# Patient Record
Sex: Male | Born: 1980 | Race: White | Hispanic: No | Marital: Single | Smoking: Current every day smoker
Health system: Southern US, Community
[De-identification: ages and names within clinical notes are randomized; demographics above are authoritative.]

---

## 2018-10-02 ENCOUNTER — Emergency Department (INDEPENDENT_AMBULATORY_CARE_PROVIDER_SITE_OTHER)
Admission: EM | Admit: 2018-10-02 | Discharge: 2018-10-02 | Disposition: A | Discharge status: Home / Self Care | Payer: BLUE CROSS/BLUE SHIELD | Source: Home / Self Care | Attending: Family Medicine | Admitting: Family Medicine

## 2018-10-02 ENCOUNTER — Other Ambulatory Visit: Payer: Self-pay

## 2018-10-02 DIAGNOSIS — F17210 Nicotine dependence, cigarettes, uncomplicated: Secondary | ICD-10-CM | POA: Diagnosis not present

## 2018-10-02 DIAGNOSIS — Z20828 Contact with and (suspected) exposure to other viral communicable diseases: Secondary | ICD-10-CM | POA: Diagnosis not present

## 2018-10-02 DIAGNOSIS — Z711 Person with feared health complaint in whom no diagnosis is made: Secondary | ICD-10-CM

## 2018-10-02 NOTE — ED Triage Notes (Signed)
Pt went through the [employer] drive through last week.  Louann Sjogren is concerned about employees, and would like patient evaluated.  Pt is symptom free.

## 2018-10-02 NOTE — Discharge Instructions (Addendum)
Recommend getting a flu shot.

## 2018-10-02 NOTE — ED Provider Notes (Signed)
[facility]    CSN: [id number] Arrival date & time: 10/02/18  1542     History   Chief Complaint Chief Complaint  Patient presents with  . Needs MD evaluation    HPI Jacob Weeks is a 38 y.o. male.   Patient reports that he went through the drive-through at [employer] one week ago.  Apparently there was a worker either in the facility or who had been in the facility, with a positive COVID-19 test.  Patient also reports that he has had no close contact with any person with symptoms suggestive of COVID-19 infection. Patient's employer is requesting that patient be evaluated. The patient is completely assymptomatic.  The history is provided by the patient.    History reviewed. No pertinent past medical history.  There are no active problems to display for this patient.   History reviewed. No pertinent surgical history.     Home Medications    Prior to Admission medications   Not on File    Family History History reviewed. No pertinent family history.  Social History Social History   Tobacco Use  . Smoking status: Current Every Day Smoker    Packs/day: 1.00  . Smokeless tobacco: Never Used  Substance Use Topics  . Alcohol use: Yes  . Drug use: Never     Allergies   Sulfa antibiotics   Review of Systems Review of Systems No sore throat No cough No pleuritic pain No wheezing No nasal congestion No post-nasal drainage No sinus pain/pressure No itchy/red eyes No earache No hemoptysis No SOB No feverchi/lls No nausea No vomiting No abdominal pain No diarrhea No urinary symptoms No skin rash No fatigue No myalgias No headache Used OTC meds without relief   Physical Exam Triage Vital Signs ED Triage Vitals  Enc Vitals Group     BP 10/02/18 1649 130/80     Pulse Rate 10/02/18 1649 71     Resp 10/02/18 1649 20     Temp 10/02/18 1649 98.2 F (36.8 C)     Temp Source 10/02/18 1649 Oral     SpO2 10/02/18 1649 97 %   Weight 10/02/18 1650 205 lb (93 kg)     Height 10/02/18 1650 5\' 6"  (1.676 m)     Head Circumference --      Peak Flow --      Pain Score 10/02/18 1650 0     Pain Loc --      Pain Edu? --      Excl. in GC? --    No data found.  Updated Vital Signs BP 130/80 (BP Location: Right Arm)   Pulse 71   Temp 98.2 F (36.8 C) (Oral)   Resp 20   Ht 5\' 6"  (1.676 m)   Wt 93 kg   SpO2 97%   BMI 33.09 kg/m   Visual Acuity Right Eye Distance:   Left Eye Distance:   Bilateral Distance:    Right Eye Near:   Left Eye Near:    Bilateral Near:     Physical Exam Nursing notes and Vital Signs reviewed. Appearance:  Patient appears stated age, and in no acute distress Eyes:  Pupils are equal, round, and reactive to light and accomodation.  Extraocular movement is intact.  Conjunctivae are not inflamed  Ears:  Canals normal.  Tympanic membranes normal.  Nose:  Normal turbinates.  No sinus tenderness.  Pharynx:  Normal Neck:  Supple.  No adenopathy.  Lungs:  Clear to auscultation.  Breath  sounds are equal.  Moving air well. Heart:  Regular rate and rhythm without murmurs, rubs, or gallops.  Abdomen:  Nontender without masses or hepatosplenomegaly.  Bowel sounds are present.  No CVA or flank tenderness.  Extremities:  No edema.  Skin:  No rash present.    UC Treatments / Results  Labs (all labs ordered are listed, but only abnormal results are displayed) Labs Reviewed - No data to display  EKG None  Radiology No results found.  Procedures Procedures (including critical care time)  Medications Ordered in UC Medications - No data to display  Initial Impression / Assessment and Plan / UC Course  I have reviewed the triage vital signs and the nursing notes.  Pertinent labs & imaging results that were available during my care of the patient were reviewed by me and considered in my medical decision making (see chart for details).    Patient at low risk for corona virus infection:   No known exposure to COVID-19 infected individual.  He has no symptoms and feels well.  He may return to work.   Final Clinical Impressions(s) / UC Diagnoses   Final diagnoses:  Feared condition not demonstrated     Discharge Instructions     Recommend getting a flu shot.    ED Prescriptions    None        Lattie Haw, MD 10/03/18 1501

## 2018-11-06 ENCOUNTER — Ambulatory Visit (INDEPENDENT_AMBULATORY_CARE_PROVIDER_SITE_OTHER): Payer: Self-pay

## 2018-11-06 ENCOUNTER — Other Ambulatory Visit: Payer: Self-pay

## 2018-11-06 ENCOUNTER — Other Ambulatory Visit: Payer: Self-pay | Admitting: Gerontology

## 2018-11-06 DIAGNOSIS — T1490XA Injury, unspecified, initial encounter: Secondary | ICD-10-CM

## 2020-03-18 IMAGING — DX RIGHT FOOT COMPLETE - 3+ VIEW
3 series · 3 of 3 positions shown · non-contrast
Comparison: None

CLINICAL DATA: Dropped a piece of equipment on foot today at work,
pain at top of foot down into big toe, initial encounter

EXAM:
RIGHT FOOT COMPLETE - 3+ VIEW

[foot ap]
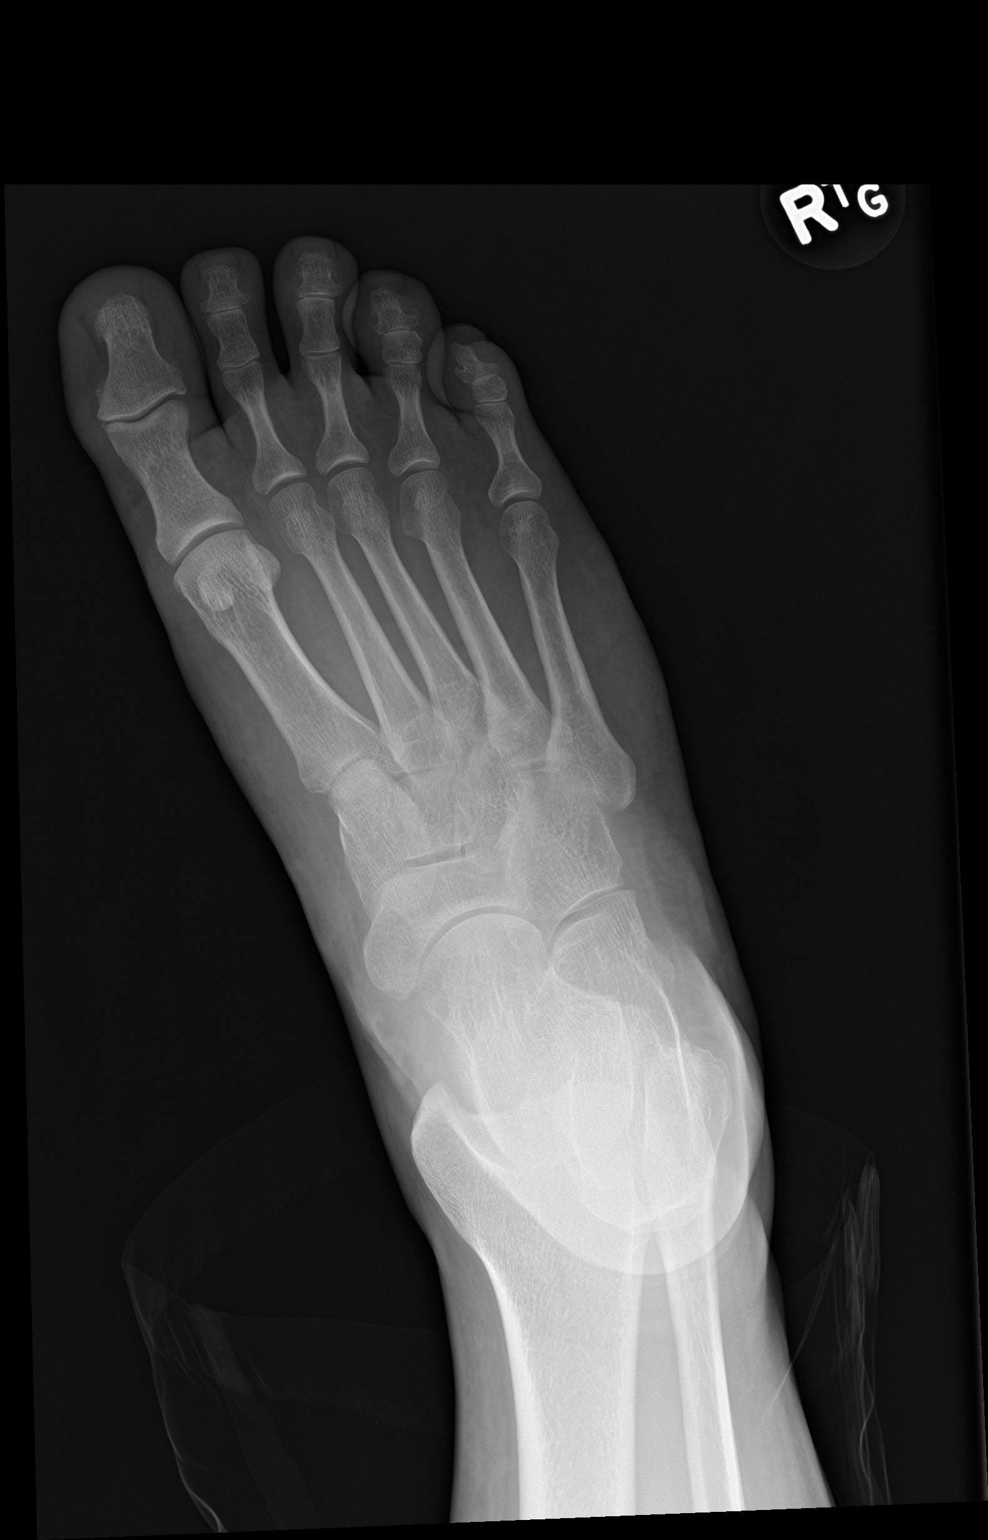

[foot obl]
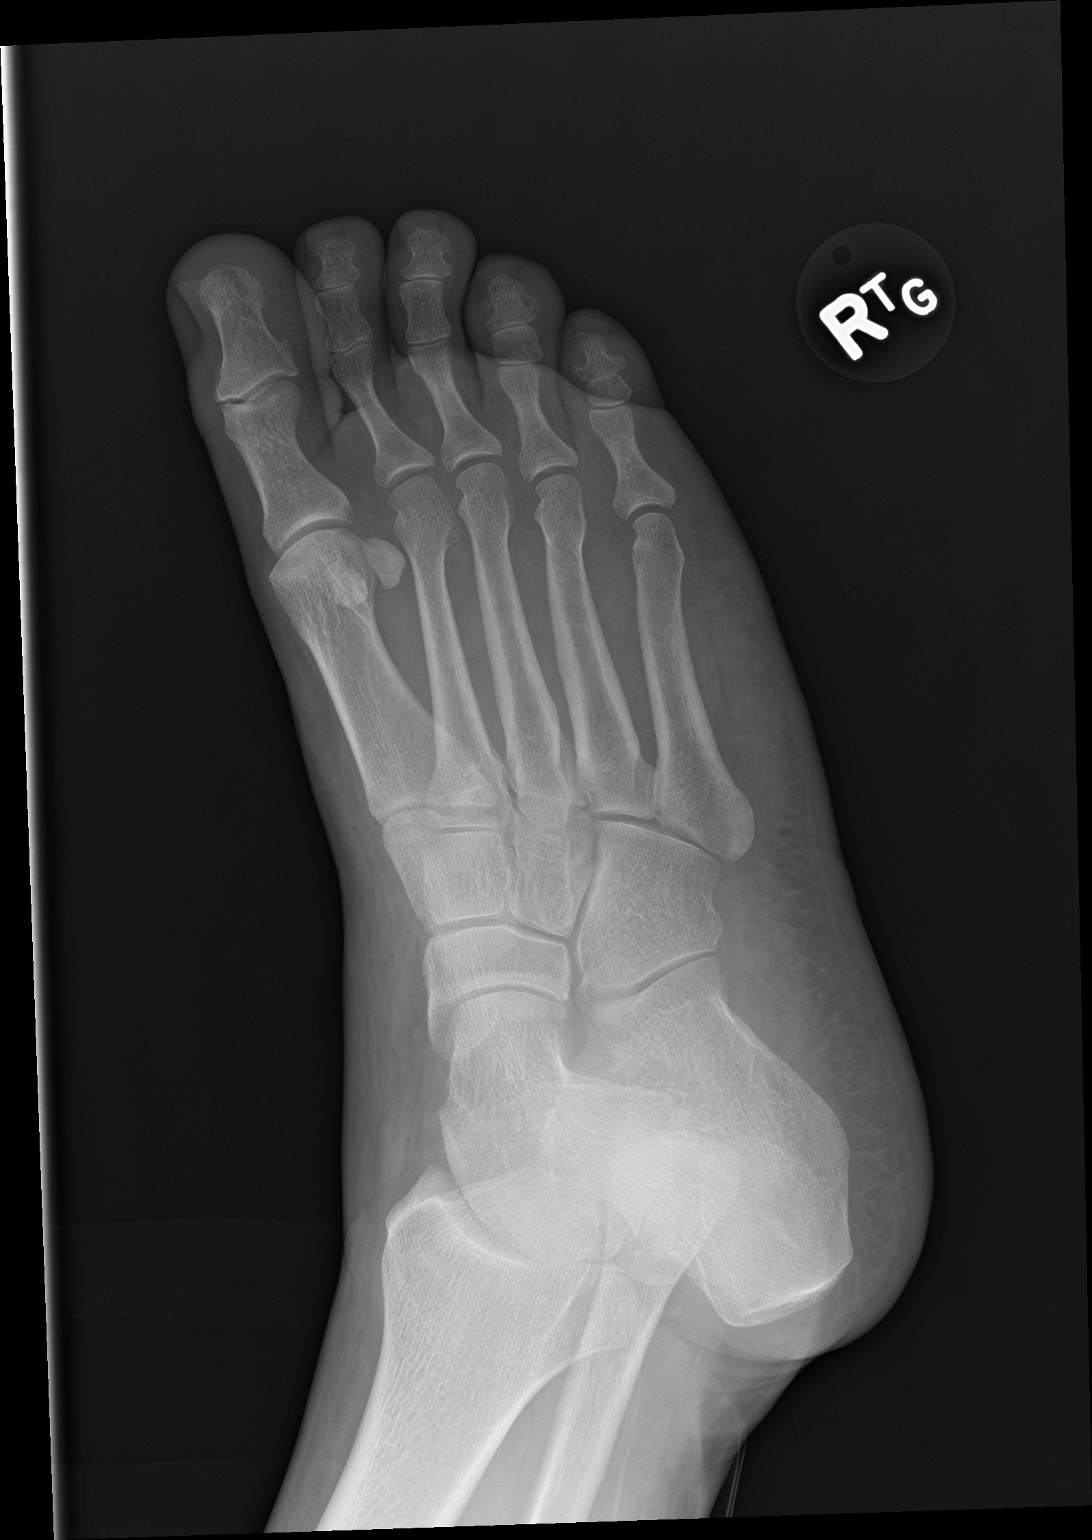

[foot lat]
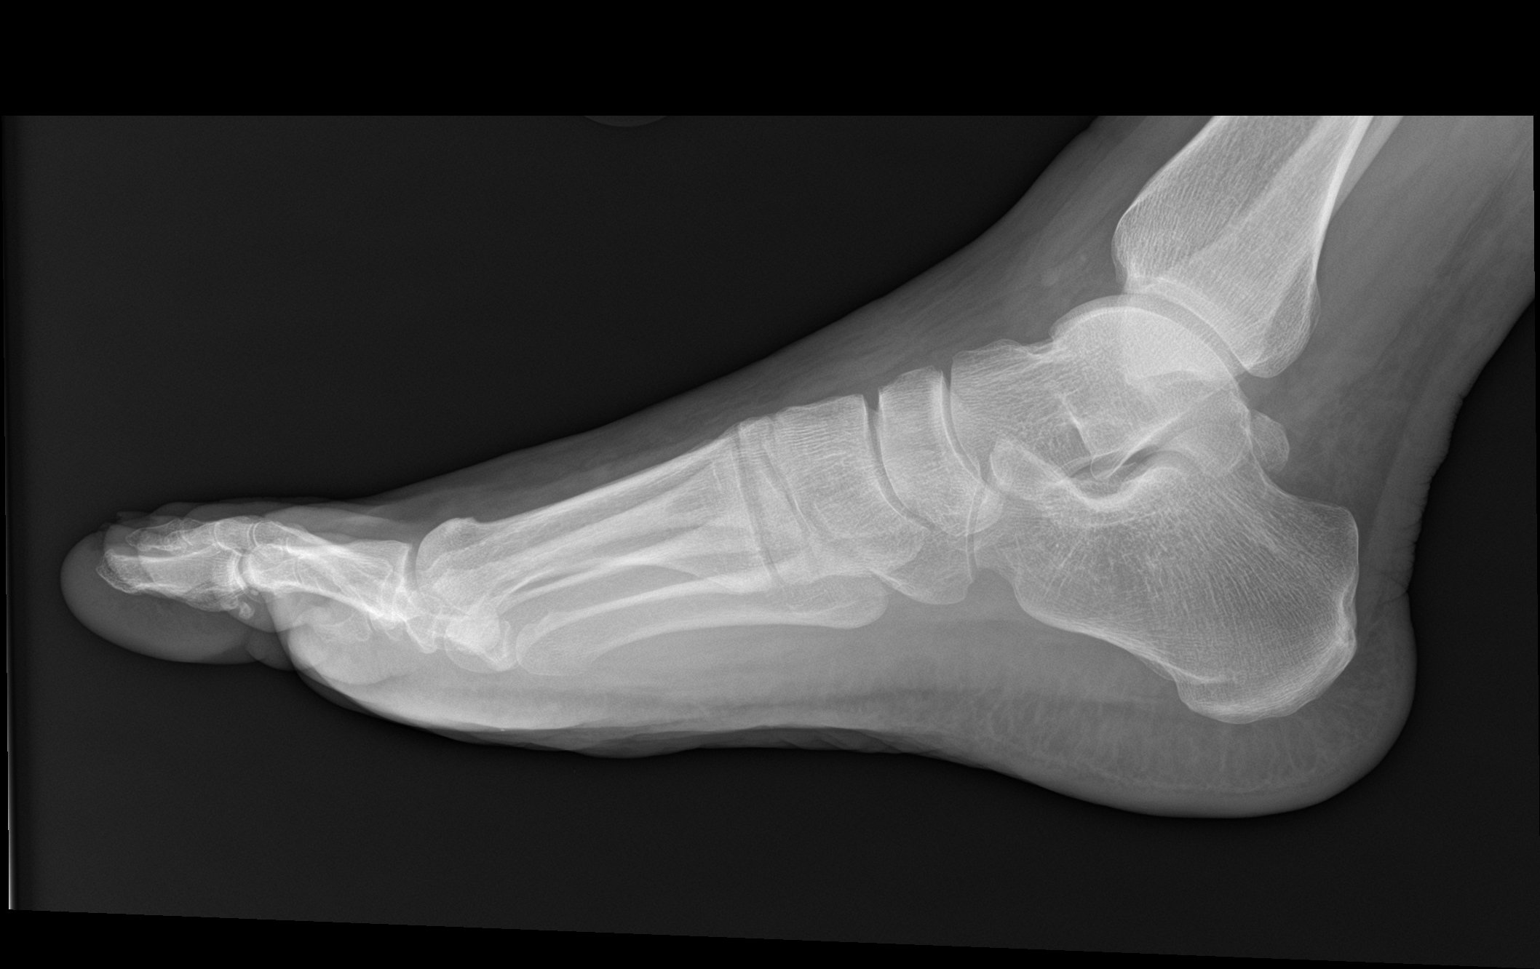

[3 of 3 positions shown; findings below may reference images not displayed]

FINDINGS: Osseous mineralization normal for technique.

Joint spaces preserved.

No acute fracture, dislocation, or bone destruction.
IMPRESSION: Normal exam.

## 2020-05-05 ENCOUNTER — Emergency Department
Admission: EM | Admit: 2020-05-05 | Discharge: 2020-05-05 | Disposition: A | Discharge status: Home / Self Care | Payer: BLUE CROSS/BLUE SHIELD | Source: Home / Self Care | Attending: Family Medicine | Admitting: Family Medicine

## 2020-05-05 ENCOUNTER — Other Ambulatory Visit: Payer: Self-pay

## 2020-05-05 DIAGNOSIS — J029 Acute pharyngitis, unspecified: Secondary | ICD-10-CM

## 2020-05-05 DIAGNOSIS — J069 Acute upper respiratory infection, unspecified: Secondary | ICD-10-CM

## 2020-05-05 LAB — POCT RAPID STREP A (OFFICE): Rapid Strep A Screen: NEGATIVE

## 2020-05-05 MED ORDER — PREDNISONE 20 MG PO TABS: ORAL_TABLET | ORAL | 0 refills | Status: DC

## 2020-05-05 MED ORDER — AZITHROMYCIN 250 MG PO TABS: ORAL_TABLET | ORAL | 0 refills | Status: DC

## 2020-05-05 NOTE — ED Triage Notes (Signed)
Pt c/o sore throat and neck tenderness x 4 days. Intermittent headache and diarrhea x 3 days. No known covid exposure. Has not had covid vaccinations.

## 2020-05-05 NOTE — Discharge Instructions (Addendum)
Take plain guaifenesin (1200mg  extended release tabs such as Mucinex) twice daily, with plenty of water, for cough and congestion.  May add Pseudoephedrine (30mg , one or two every 4 to 6 hours) for sinus congestion.  Get adequate rest.   May take Delsym Cough Suppressant ("12 Hour Cough Relief") at bedtime for nighttime cough.  Try warm salt water gargles for sore throat.  Stop all antihistamines for now, and other non-prescription cough/cold preparations. May take Tylenol for fever, headache, etc.   Isolate yourself until COVID-19 test result is available.   If your COVID19 test is positive, then you are infected with the novel coronavirus and could give the virus to others.  Please continue isolation at home for at least 10 days since the start of your symptoms. Once you complete your 10 day quarantine, you may return to normal activities as long as you've not had a fever for over 24 hours (without taking fever reducing medicine) and your symptoms are improving. Please continue good preventive care measures, including:  frequent hand-washing, avoid touching your face, cover coughs/sneezes, stay out of crowds and keep a 6 foot distance from others.  Go to the nearest hospital emergency room if fever/cough/breathlessness are severe or illness seems like a threat to life.

## 2020-05-05 NOTE — ED Provider Notes (Signed)
[facility]    CSN: [id number] Arrival date & time: 05/05/20  1123      History   Chief Complaint Chief Complaint  Patient presents with  . Sore Throat    HPI Jacob Weeks is a 39 y.o. male.    Patient developed increased cough about 8 days ago.  During the past four days he developed sore throat and sore neck, fatigue, headache, and increased cough.  He had intermittent diarrhea for about two days.  His cough is productive and he has had mild shortness of breath with activity.   He denies chest tightness and changes in taste/smell.  The history is provided by the patient.    History reviewed. No pertinent past medical history.  There are no problems to display for this patient.   History reviewed. No pertinent surgical history.     Home Medications    Prior to Admission medications   Medication Sig Start Date End Date Taking? Authorizing Provider  azithromycin (ZITHROMAX Z-PAK) 250 MG tablet Take 2 tabs today; then begin one tab once daily for 4 more days. 05/05/20   Lattie Haw, MD  predniSONE (DELTASONE) 20 MG tablet Take one tab by mouth twice daily for 4 days, then one daily for 3 days. Take with food. 05/05/20   Lattie Haw, MD    Family History History reviewed. No pertinent family history.  Social History Social History   Tobacco Use  . Smoking status: Current Every Day Smoker    Packs/day: 1.50  . Smokeless tobacco: Never Used  Vaping Use  . Vaping Use: Never used  Substance Use Topics  . Alcohol use: Yes  . Drug use: Never     Allergies   Sulfa antibiotics   Review of Systems Review of Systems + sore throat and sore neck + cough No pleuritic pain ? wheezing + nasal congestion + post-nasal drainage + sinus pain/pressure No itchy/red eyes No earache No hemoptysis + mild SOB No fever/chills No nausea No vomiting No abdominal pain + diarrhea, resolved No urinary symptoms No skin rash + fatigue +  myalgias + headache    Physical Exam Triage Vital Signs ED Triage Vitals  Enc Vitals Group     BP 05/05/20 1149 (!) 156/78     Pulse Rate 05/05/20 1149 71     Resp 05/05/20 1149 17     Temp 05/05/20 1149 98.2 F (36.8 C)     Temp Source 05/05/20 1149 Oral     SpO2 05/05/20 1149 98 %     Weight --      Height --      Head Circumference --      Peak Flow --      Pain Score 05/05/20 1152 3     Pain Loc --      Pain Edu? --      Excl. in GC? --    No data found.  Updated Vital Signs BP (!) 156/78 (BP Location: Right Arm)   Pulse 71   Temp 98.2 F (36.8 C) (Oral)   Resp 17   SpO2 98%   Visual Acuity Right Eye Distance:   Left Eye Distance:   Bilateral Distance:    Right Eye Near:   Left Eye Near:    Bilateral Near:     Physical Exam Nursing notes and Vital Signs reviewed. Appearance:  Patient appears stated age, and in no acute distress Eyes:  Pupils are equal, round, and reactive to light  and accomodation.  Extraocular movement is intact.  Conjunctivae are not inflamed  Ears:  Canals normal.  Tympanic membranes normal.  Nose:  Mildly congested turbinates.  No sinus tenderness.   Pharynx:  Normal Neck:  Supple.  Mildly enlarged lateral nodes are present, tender to palpation on the left.  Tonsillar nodes are tender. Lungs:  Diffuse bilateral wheezes and rhonchi.  Breath sounds are equal.  Moving air well. Heart:  Regular rate and rhythm without murmurs, rubs, or gallops.  Abdomen:  Nontender without masses or hepatosplenomegaly.  Bowel sounds are present.  No CVA or flank tenderness.  Extremities:  No edema.  Skin:  No rash present.   UC Treatments / Results  Labs (all labs ordered are listed, but only abnormal results are displayed) Labs Reviewed  NOVEL CORONAVIRUS, NAA  CULTURE, GROUP A STREP [facility])  POCT RAPID STREP A (OFFICE) negative    EKG   Radiology No results found.  Procedures Procedures (including critical care time)  Medications Ordered  in UC Medications - No data to display  Initial Impression / Assessment and Plan / UC Course  I have reviewed the triage vital signs and the nursing notes.  Pertinent labs & imaging results that were available during my care of the patient were reviewed by me and considered in my medical decision making (see chart for details).    Begin prednisone burst/taper and Z-pak for atypical coverage. COVID PCR pending. Followup with [facility] if not improved in about 10 days.   Final Clinical Impressions(s) / UC Diagnoses   Final diagnoses:  Acute pharyngitis, unspecified etiology  Viral URI with cough     Discharge Instructions     Take plain guaifenesin (1200mg  extended release tabs such as Mucinex) twice daily, with plenty of water, for cough and congestion.  May add Pseudoephedrine (30mg , one or two every 4 to 6 hours) for sinus congestion.  Get adequate rest.   May take Delsym Cough Suppressant ("12 Hour Cough Relief") at bedtime for nighttime cough.  Try warm salt water gargles for sore throat.  Stop all antihistamines for now, and other non-prescription cough/cold preparations. May take Tylenol for fever, headache, etc.   Isolate yourself until COVID-19 test result is available.   If your COVID19 test is positive, then you are infected with the novel coronavirus and could give the virus to others.  Please continue isolation at home for at least 10 days since the start of your symptoms. Once you complete your 10 day quarantine, you may return to normal activities as long as you've not had a fever for over 24 hours (without taking fever reducing medicine) and your symptoms are improving. Please continue good preventive care measures, including:  frequent hand-washing, avoid touching your face, cover coughs/sneezes, stay out of crowds and keep a 6 foot distance from others.  Go to the nearest hospital emergency room if fever/cough/breathlessness are severe or illness seems like a threat  to life.     ED Prescriptions    Medication Sig Dispense Auth. Provider   predniSONE (DELTASONE) 20 MG tablet Take one tab by mouth twice daily for 4 days, then one daily for 3 days. Take with food. 11 tablet , MD   azithromycin (ZITHROMAX Z-PAK) 250 MG tablet Take 2 tabs today; then begin one tab once daily for 4 more days. 6 tablet , MD        Lattie Haw, MD 05/08/20 [id number]

## 2020-05-06 LAB — SARS-COV-2, NAA 2 DAY TAT

## 2020-05-06 LAB — NOVEL CORONAVIRUS, NAA: SARS-CoV-2, NAA: NOT DETECTED

## 2020-05-07 LAB — CULTURE, GROUP A STREP
MICRO NUMBER:: 11119478
SPECIMEN QUALITY:: ADEQUATE

## 2021-12-15 ENCOUNTER — Emergency Department
Admission: EM | Admit: 2021-12-15 | Discharge: 2021-12-15 | Disposition: A | Discharge status: Home / Self Care | Payer: BC Managed Care – PPO | Source: Home / Self Care

## 2021-12-15 DIAGNOSIS — R21 Rash and other nonspecific skin eruption: Secondary | ICD-10-CM | POA: Diagnosis not present

## 2021-12-15 DIAGNOSIS — L237 Allergic contact dermatitis due to plants, except food: Secondary | ICD-10-CM

## 2021-12-15 MED ORDER — METHYLPREDNISOLONE ACETATE 80 MG/ML IJ SUSP
80.0000 mg | Freq: Once | INTRAMUSCULAR | Status: AC
  Administered 2021-12-15: 80 mg via INTRAMUSCULAR

## 2021-12-15 MED ORDER — PREDNISONE 10 MG (21) PO TBPK: ORAL_TABLET | Freq: Every day | ORAL | 0 refills | Status: AC

## 2021-12-15 NOTE — Discharge Instructions (Addendum)
Instructed patient to take medication as directed with food to completion.  Encouraged patient to increase daily water intake while taking these medications.  Advised/encouraged patient to change bed linens for the next 3 nights to avoid recontamination.

## 2021-12-15 NOTE — ED Triage Notes (Addendum)
Pt c/o rash from poison ivy since Sunday. Continues to spread and some areas on arm are weeping. Benedryl and Calamine prn.

## 2021-12-15 NOTE — ED Provider Notes (Signed)
[facility]    CSN: [id number] Arrival date & time: 12/15/21  0903      History   Chief Complaint Chief Complaint  Patient presents with   Rash    Poison Ivy    HPI Jacob Weeks is a 41 y.o. male.   HPI 41 year old male presents with poison ivy rash for 3 days.  Patient reports continues to spread and is currently using OTC Benadryl lotion as needed.  History reviewed. No pertinent past medical history.  There are no problems to display for this patient.   History reviewed. No pertinent surgical history.     Home Medications    Prior to Admission medications   Medication Sig Start Date End Date Taking? Authorizing Provider  predniSONE (STERAPRED UNI-PAK 21 TAB) 10 MG (21) TBPK tablet Take by mouth daily. Take 6 tabs by mouth daily  for 2 days, then 5 tabs for 2 days, then 4 tabs for 2 days, then 3 tabs for 2 days, 2 tabs for 2 days, then 1 tab by mouth daily for 2 days 12/15/21  Yes Trevor Iha, FNP    Family History History reviewed. No pertinent family history.  Social History Social History   Tobacco Use   Smoking status: Every Day    Packs/day: 1.50    Types: Cigarettes   Smokeless tobacco: Never  Vaping Use   Vaping Use: Never used  Substance Use Topics   Alcohol use: Yes   Drug use: Never     Allergies   Sulfa antibiotics   Review of Systems Review of Systems  Skin:  Positive for rash.    Physical Exam Triage Vital Signs ED Triage Vitals  Enc Vitals Group     BP      Pulse      Resp      Temp      Temp src      SpO2      Weight      Height      Head Circumference      Peak Flow      Pain Score      Pain Loc      Pain Edu?      Excl. in GC?    No data found.  Updated Vital Signs BP (!) 144/75 (BP Location: Left Arm)   Pulse 73   Temp 97.7 F (36.5 C) (Oral)   Resp 18   SpO2 98%       Physical Exam Vitals and nursing note reviewed.  Constitutional:      Appearance: Normal appearance. He is obese.   HENT:     Head: Normocephalic and atraumatic.     Mouth/Throat:     Mouth: Mucous membranes are moist.     Pharynx: Oropharynx is clear.  Eyes:     Extraocular Movements: Extraocular movements intact.     Conjunctiva/sclera: Conjunctivae normal.     Pupils: Pupils are equal, round, and reactive to light.  Cardiovascular:     Rate and Rhythm: Normal rate and regular rhythm.     Pulses: Normal pulses.     Heart sounds: Normal heart sounds.  Pulmonary:     Effort: Pulmonary effort is normal.     Breath sounds: Normal breath sounds. No wheezing, rhonchi or rales.  Musculoskeletal:     Cervical back: Normal range of motion and neck supple.  Skin:    General: Skin is warm and dry.     Comments: Upper/lower arms/wrists/hands-bilaterally, lower  abdomen, groin: Pruritic erythematous maculopapular eruption with crusting vesicular lesions noted  Neurological:     General: No focal deficit present.     Mental Status: He is alert and oriented to person, place, and time.     UC Treatments / Results  Labs (all labs ordered are listed, but only abnormal results are displayed) Labs Reviewed - No data to display  EKG   Radiology No results found.  Procedures Procedures (including critical care time)  Medications Ordered in UC Medications  methylPREDNISolone acetate (DEPO-MEDROL) injection 80 mg (80 mg Intramuscular Given 12/15/21 0935)    Initial Impression / Assessment and Plan / UC Course  I have reviewed the triage vital signs and the nursing notes.  Pertinent labs & imaging results that were available during my care of the patient were reviewed by me and considered in my medical decision making (see chart for details).     MDM: 1.  Poison ivy dermatitis-IM Solu-Medrol 80 mg given once in clinic prior to discharge Rx'd Sterapred uni-pak. Instructed patient to take medication as directed with food to completion.  Encouraged patient to increase daily water intake while taking  these medications.  Advised/encouraged patient to change bed linens for the next 3 nights to avoid recontamination.  Patient discharged home, hemodynamically stable.  Final Clinical Impressions(s) / UC Diagnoses   Final diagnoses:  Poison ivy dermatitis     Discharge Instructions      Instructed patient to take medication as directed with food to completion.  Encouraged patient to increase daily water intake while taking these medications.  Advised/encouraged patient to change bed linens for the next 3 nights to avoid recontamination.     ED Prescriptions     Medication Sig Dispense Auth. Provider   predniSONE (STERAPRED UNI-PAK 21 TAB) 10 MG (21) TBPK tablet Take by mouth daily. Take 6 tabs by mouth daily  for 2 days, then 5 tabs for 2 days, then 4 tabs for 2 days, then 3 tabs for 2 days, 2 tabs for 2 days, then 1 tab by mouth daily for 2 days 42 tablet Trevor Iha, FNP      PDMP not reviewed this encounter.   Trevor Iha, FNP 12/15/21 [phone]
